# Patient Record
Sex: Female | Born: 1950 | Race: White | Hispanic: No | Marital: Married | Smoking: Never smoker
Health system: Southern US, Community
[De-identification: ages and names within clinical notes are randomized; demographics above are authoritative.]

## PROBLEM LIST (undated history)

## (undated) DIAGNOSIS — N6459 Other signs and symptoms in breast: Secondary | ICD-10-CM

---

## 2003-07-17 ENCOUNTER — Emergency Department (HOSPITAL_COMMUNITY): Admission: EM | Admit: 2003-07-17 | Discharge: 2003-07-18 | Payer: Self-pay | Admitting: Emergency Medicine

## 2003-08-19 ENCOUNTER — Emergency Department (HOSPITAL_COMMUNITY): Admission: EM | Admit: 2003-08-19 | Discharge: 2003-08-19 | Payer: Self-pay

## 2003-12-02 ENCOUNTER — Other Ambulatory Visit: Admission: RE | Admit: 2003-12-02 | Discharge: 2003-12-02 | Payer: Self-pay | Admitting: Obstetrics and Gynecology

## 2004-06-17 ENCOUNTER — Encounter: Admission: RE | Admit: 2004-06-17 | Discharge: 2004-06-17 | Payer: Self-pay | Admitting: Internal Medicine

## 2004-07-07 ENCOUNTER — Encounter: Admission: RE | Admit: 2004-07-07 | Discharge: 2004-07-07 | Payer: Self-pay | Admitting: Internal Medicine

## 2004-07-20 ENCOUNTER — Encounter: Admission: RE | Admit: 2004-07-20 | Discharge: 2004-07-20 | Payer: Self-pay | Admitting: Obstetrics and Gynecology

## 2005-03-23 ENCOUNTER — Other Ambulatory Visit: Admission: RE | Admit: 2005-03-23 | Discharge: 2005-03-23 | Payer: Self-pay | Admitting: Obstetrics and Gynecology

## 2005-03-31 ENCOUNTER — Encounter: Admission: RE | Admit: 2005-03-31 | Discharge: 2005-03-31 | Payer: Self-pay | Admitting: Obstetrics and Gynecology

## 2005-06-28 HISTORY — PX: BREAST EXCISIONAL BIOPSY: SUR124

## 2005-08-02 ENCOUNTER — Ambulatory Visit: Payer: Self-pay | Admitting: Oncology

## 2005-08-11 ENCOUNTER — Encounter: Admission: RE | Admit: 2005-08-11 | Discharge: 2005-08-11 | Payer: Self-pay | Admitting: General Surgery

## 2005-08-25 ENCOUNTER — Encounter: Admission: RE | Admit: 2005-08-25 | Discharge: 2005-08-25 | Payer: Self-pay | Admitting: General Surgery

## 2005-10-26 ENCOUNTER — Ambulatory Visit: Payer: Self-pay | Admitting: Oncology

## 2006-02-07 ENCOUNTER — Ambulatory Visit: Payer: Self-pay | Admitting: Oncology

## 2006-03-04 ENCOUNTER — Encounter: Admission: RE | Admit: 2006-03-04 | Discharge: 2006-03-04 | Payer: Self-pay | Admitting: General Surgery

## 2006-03-28 ENCOUNTER — Emergency Department (HOSPITAL_COMMUNITY): Admission: EM | Admit: 2006-03-28 | Discharge: 2006-03-28 | Payer: Self-pay | Admitting: Family Medicine

## 2006-07-06 ENCOUNTER — Other Ambulatory Visit: Admission: RE | Admit: 2006-07-06 | Discharge: 2006-07-06 | Payer: Self-pay | Admitting: Internal Medicine

## 2006-08-12 ENCOUNTER — Encounter: Admission: RE | Admit: 2006-08-12 | Discharge: 2006-08-12 | Payer: Self-pay | Admitting: Internal Medicine

## 2006-09-14 ENCOUNTER — Ambulatory Visit: Payer: Self-pay | Admitting: Oncology

## 2007-08-14 ENCOUNTER — Encounter: Admission: RE | Admit: 2007-08-14 | Discharge: 2007-08-14 | Payer: Self-pay | Admitting: Internal Medicine

## 2008-02-12 ENCOUNTER — Other Ambulatory Visit: Admission: RE | Admit: 2008-02-12 | Discharge: 2008-02-12 | Payer: Self-pay | Admitting: Obstetrics and Gynecology

## 2008-08-30 ENCOUNTER — Encounter: Admission: RE | Admit: 2008-08-30 | Discharge: 2008-08-30 | Payer: Self-pay | Admitting: Obstetrics and Gynecology

## 2008-08-30 IMAGING — MG MM SCREEN MAMMOGRAM BILATERAL
4 series · 4 of 4 positions shown · non-contrast
Comparison: Prior studies.

DG SCREEN MAMMOGRAM BILATERAL
Bilateral CC and MLO view(s) were taken.
Technologist: FASULI

DIGITAL SCREENING MAMMOGRAM WITH CAD:

[R CC]
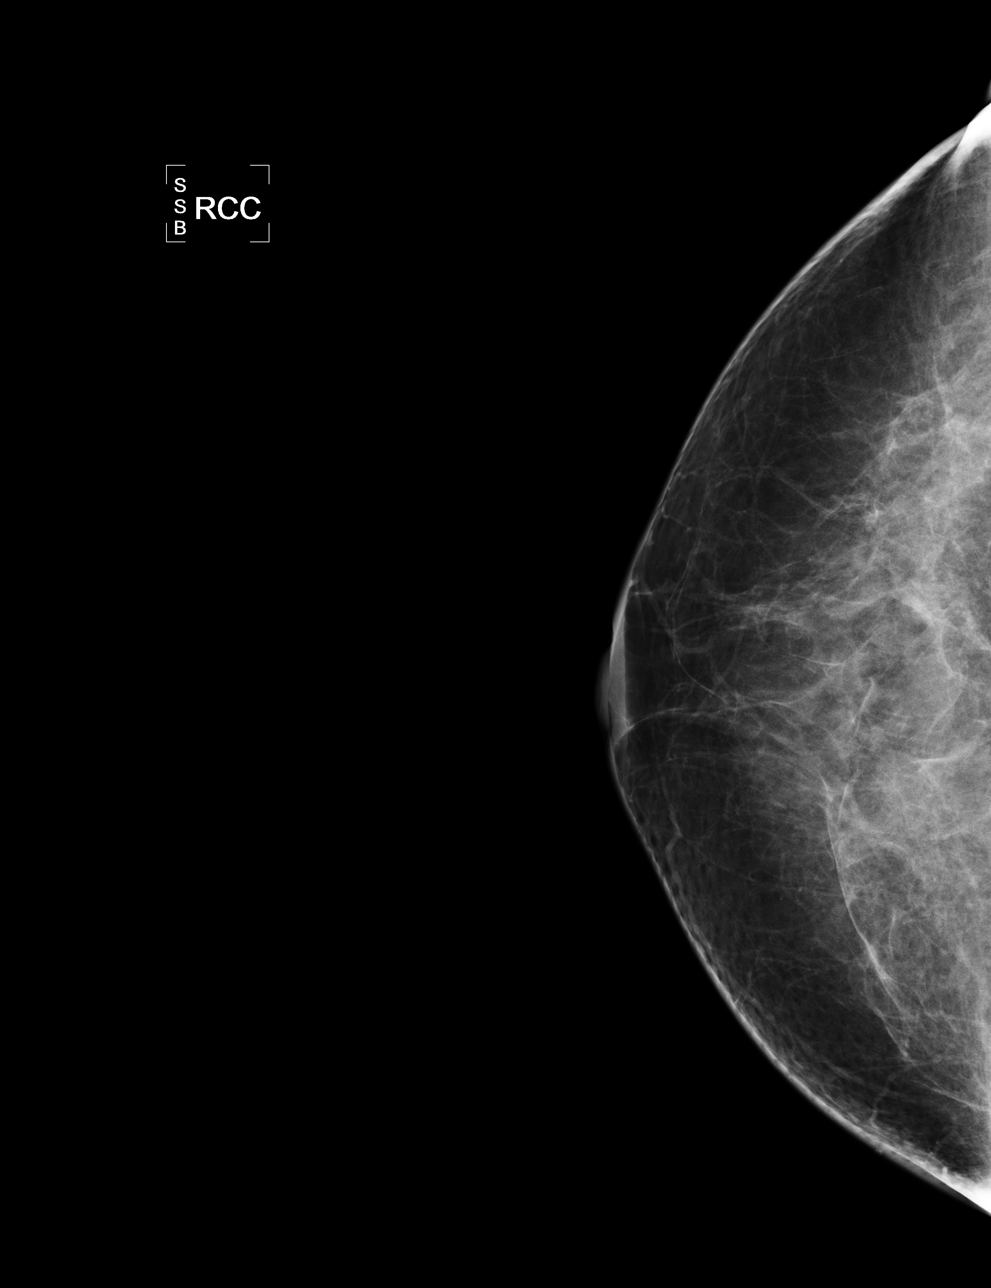

[L CC]
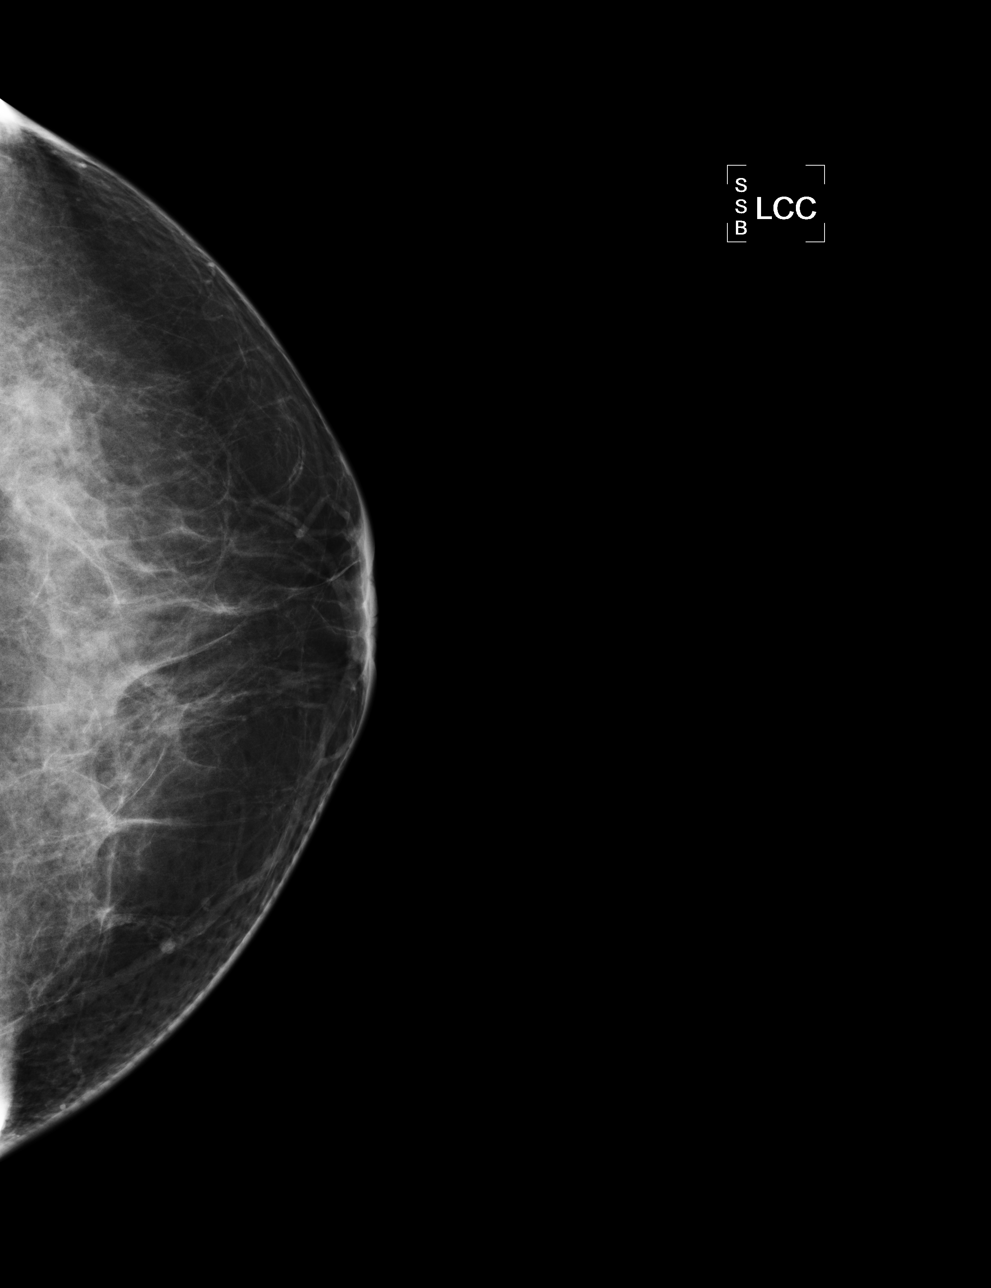

[L MLO]
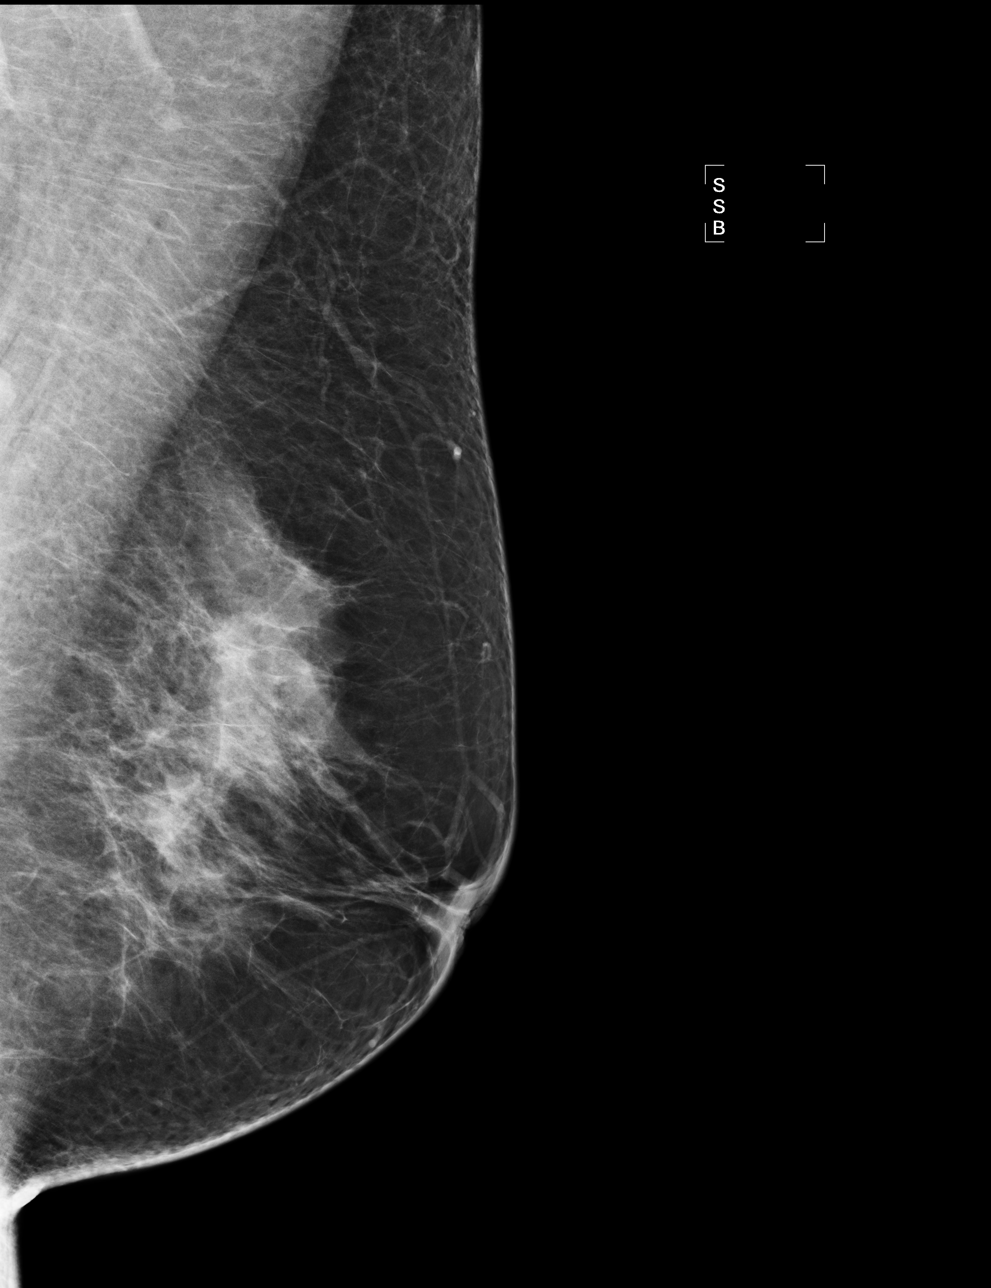

[R MLO]
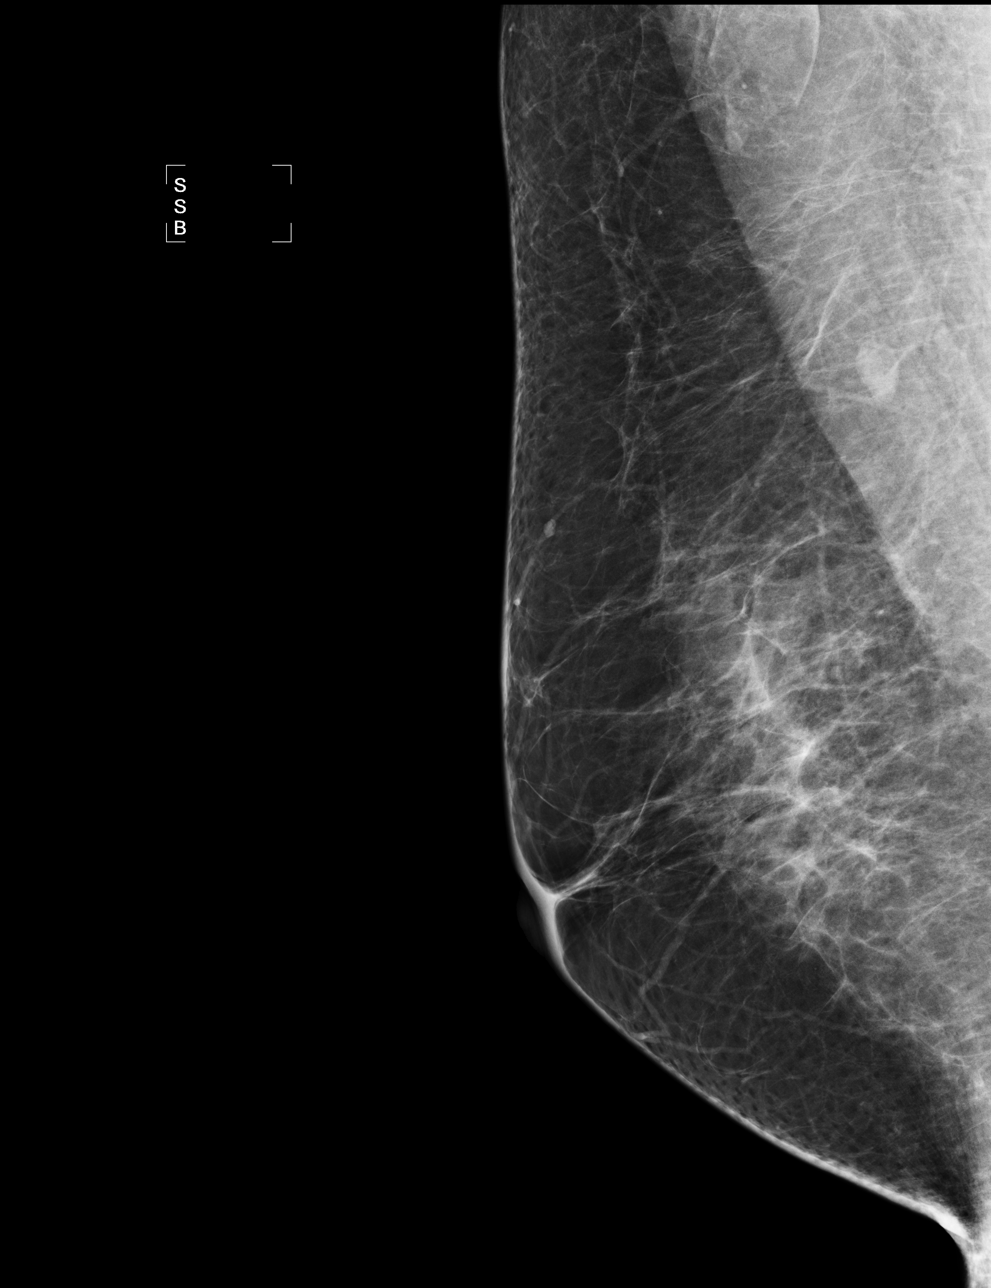

[4 of 4 positions shown; findings below may reference images not displayed]

There are scattered fibroglandular densities.  There is no dominant mass, architectural distortion 
or calcification to suggest malignancy.
IMPRESSION: No mammographic evidence of malignancy.  Suggest yearly screening mammography.

A result letter of this screening mammogram will be mailed directly to the patient.

ASSESSMENT: Negative - BI-RADS 1

Screening mammogram in 1 year.
ANALYZED BY COMPUTER AIDED DETECTION. , THIS PROCEDURE WAS A DIGITAL MAMMOGRAM.

## 2009-02-18 ENCOUNTER — Other Ambulatory Visit: Admission: RE | Admit: 2009-02-18 | Discharge: 2009-02-18 | Payer: Self-pay | Admitting: Obstetrics and Gynecology

## 2009-03-06 ENCOUNTER — Encounter: Admission: RE | Admit: 2009-03-06 | Discharge: 2009-03-06 | Payer: Self-pay | Admitting: Obstetrics and Gynecology

## 2009-09-09 ENCOUNTER — Encounter: Admission: RE | Admit: 2009-09-09 | Discharge: 2009-09-09 | Payer: Self-pay | Admitting: Obstetrics and Gynecology

## 2009-09-16 ENCOUNTER — Encounter: Admission: RE | Admit: 2009-09-16 | Discharge: 2009-09-16 | Payer: Self-pay | Admitting: Obstetrics and Gynecology

## 2010-07-18 ENCOUNTER — Encounter: Payer: Self-pay | Admitting: Internal Medicine

## 2010-08-19 ENCOUNTER — Other Ambulatory Visit: Payer: Self-pay | Admitting: Internal Medicine

## 2010-08-19 ENCOUNTER — Other Ambulatory Visit: Payer: Self-pay | Admitting: Physician Assistant

## 2010-08-19 DIAGNOSIS — Z1231 Encounter for screening mammogram for malignant neoplasm of breast: Secondary | ICD-10-CM

## 2010-09-24 ENCOUNTER — Ambulatory Visit
Admission: RE | Admit: 2010-09-24 | Discharge: 2010-09-24 | Disposition: A | Discharge status: Home / Self Care | Payer: BC Managed Care – PPO | Source: Ambulatory Visit | Attending: Internal Medicine | Admitting: Internal Medicine

## 2010-09-24 DIAGNOSIS — Z1231 Encounter for screening mammogram for malignant neoplasm of breast: Secondary | ICD-10-CM

## 2011-08-16 ENCOUNTER — Other Ambulatory Visit: Payer: Self-pay | Admitting: Internal Medicine

## 2011-08-16 DIAGNOSIS — Z1231 Encounter for screening mammogram for malignant neoplasm of breast: Secondary | ICD-10-CM

## 2011-09-27 ENCOUNTER — Ambulatory Visit
Admission: RE | Admit: 2011-09-27 | Discharge: 2011-09-27 | Disposition: A | Discharge status: Home / Self Care | Payer: BC Managed Care – PPO | Source: Ambulatory Visit | Attending: Internal Medicine | Admitting: Internal Medicine

## 2011-09-27 DIAGNOSIS — Z1231 Encounter for screening mammogram for malignant neoplasm of breast: Secondary | ICD-10-CM

## 2012-08-22 ENCOUNTER — Other Ambulatory Visit: Payer: Self-pay | Admitting: Internal Medicine

## 2012-08-22 DIAGNOSIS — Z1231 Encounter for screening mammogram for malignant neoplasm of breast: Secondary | ICD-10-CM

## 2012-09-28 ENCOUNTER — Ambulatory Visit
Admission: RE | Admit: 2012-09-28 | Discharge: 2012-09-28 | Disposition: A | Discharge status: Home / Self Care | Payer: BC Managed Care – PPO | Source: Ambulatory Visit | Attending: Internal Medicine | Admitting: Internal Medicine

## 2012-09-28 DIAGNOSIS — Z1231 Encounter for screening mammogram for malignant neoplasm of breast: Secondary | ICD-10-CM

## 2013-08-30 ENCOUNTER — Other Ambulatory Visit: Payer: Self-pay

## 2013-08-30 ENCOUNTER — Other Ambulatory Visit: Payer: Self-pay | Admitting: Internal Medicine

## 2013-08-30 DIAGNOSIS — Z1231 Encounter for screening mammogram for malignant neoplasm of breast: Secondary | ICD-10-CM

## 2013-10-17 ENCOUNTER — Ambulatory Visit: Payer: BC Managed Care – PPO

## 2013-11-06 ENCOUNTER — Ambulatory Visit
Admission: RE | Admit: 2013-11-06 | Discharge: 2013-11-06 | Disposition: A | Discharge status: Home / Self Care | Payer: BC Managed Care – PPO | Source: Ambulatory Visit | Attending: Internal Medicine | Admitting: Internal Medicine

## 2013-11-06 DIAGNOSIS — Z1231 Encounter for screening mammogram for malignant neoplasm of breast: Secondary | ICD-10-CM

## 2014-09-30 ENCOUNTER — Other Ambulatory Visit: Payer: Self-pay

## 2014-09-30 DIAGNOSIS — Z1231 Encounter for screening mammogram for malignant neoplasm of breast: Secondary | ICD-10-CM

## 2014-11-12 ENCOUNTER — Ambulatory Visit
Admission: RE | Admit: 2014-11-12 | Discharge: 2014-11-12 | Disposition: A | Discharge status: Home / Self Care | Payer: BC Managed Care – PPO | Source: Ambulatory Visit

## 2014-11-12 DIAGNOSIS — Z1231 Encounter for screening mammogram for malignant neoplasm of breast: Secondary | ICD-10-CM

## 2014-12-24 ENCOUNTER — Ambulatory Visit: Payer: BC Managed Care – PPO

## 2014-12-24 ENCOUNTER — Ambulatory Visit (INDEPENDENT_AMBULATORY_CARE_PROVIDER_SITE_OTHER): Payer: BC Managed Care – PPO | Admitting: Podiatry

## 2014-12-24 ENCOUNTER — Ambulatory Visit (INDEPENDENT_AMBULATORY_CARE_PROVIDER_SITE_OTHER): Payer: BC Managed Care – PPO

## 2014-12-24 ENCOUNTER — Encounter: Payer: Self-pay | Admitting: Podiatry

## 2014-12-24 VITALS — BP 125/75 | HR 81 | Resp 12

## 2014-12-24 DIAGNOSIS — B351 Tinea unguium: Secondary | ICD-10-CM

## 2014-12-24 DIAGNOSIS — M779 Enthesopathy, unspecified: Secondary | ICD-10-CM

## 2014-12-24 DIAGNOSIS — R52 Pain, unspecified: Secondary | ICD-10-CM

## 2014-12-24 DIAGNOSIS — L600 Ingrowing nail: Secondary | ICD-10-CM

## 2014-12-24 NOTE — Patient Instructions (Signed)

## 2014-12-24 NOTE — Progress Notes (Signed)
   Subjective:    Patient ID: Ruth Warren, female    DOB: 05-29-1951, [age] y.o.   MRN: [id number]  HPI L Great toe nail fungus, R great toe nail, thickened and prone to ingrowns. May need debridement   Review of Systems     Objective:   Physical Exam        Assessment & Plan:

## 2014-12-24 NOTE — Progress Notes (Signed)
Subjective:     Patient ID: Ruth Warren, female   DOB: [birth year], 64 y.o.   MRN: [id number]  HPI patient presents with several problems one being incurvated nail border right hallux medial border that's painful thickened left hallux nail that she's not sure what can be done with and discomfort in the arch of both feet along with structural bunion deformity bilateral which is gotten gradually worse   Review of Systems  All other systems reviewed and are negative.      Objective:   Physical Exam  Constitutional: She is oriented to person, place, and time.  Cardiovascular: Intact distal pulses.   Musculoskeletal: Normal range of motion.  Neurological: She is oriented to person, place, and time.  Skin: Skin is warm.  Nursing note and vitals reviewed.  neurovascular status intact muscle strength adequate with range of motion subtalar midtarsal joint within normal limits. Patient's noted to have incurvated right hallux medial border that's painful when pressed hyperostosis medial aspect first metatarsal bilateral with rigid contracture second digit left and also noted to have a thickened hallux nail left that's moderately loose not painful and pain in the arch region of both feet when palpated. Good digital perfusion is noted patient's well oriented 3     Assessment:     Ingrown toenail deformity right hallux medial border with pain along with thick and mycotic nail left hallux that is localized in nature and arch pain secondary to diminishment of the arch along with structural imbalances of both feet    Plan:     H&P and all conditions reviewed with patient. At this point I recommended correction of the ingrown toenail and explained the procedure to patient and risk involved. She wants surgery and I infiltrated 60 Milligan times like Marcaine mixture remove the hallux nail border exposed matrix and applied phenol 3 applications 30 seconds followed by alcohol lavage and sterile dressing. I  then scanned for custom orthotics to lift up the arches of both feet and reviewed her x-rays and discussed surgical correction at one point in the future

## 2014-12-30 ENCOUNTER — Encounter: Payer: Self-pay | Admitting: Podiatry

## 2015-01-24 ENCOUNTER — Ambulatory Visit: Payer: BC Managed Care – PPO | Admitting: *Deleted

## 2015-01-24 DIAGNOSIS — M779 Enthesopathy, unspecified: Secondary | ICD-10-CM

## 2015-01-24 NOTE — Patient Instructions (Signed)

## 2015-01-24 NOTE — Progress Notes (Signed)
Patient ID: GAE BIHL, female   DOB: [birth year], 64 y.o.   MRN: [id number] Patient presents for orthotic pick up.  Verbal and written break in and wear instructions given.  Patient will follow up in 4 weeks if symptoms worsen or fail to improve.

## 2015-08-20 ENCOUNTER — Encounter: Payer: Self-pay | Admitting: Podiatry

## 2015-08-20 ENCOUNTER — Ambulatory Visit (INDEPENDENT_AMBULATORY_CARE_PROVIDER_SITE_OTHER): Payer: BC Managed Care – PPO | Admitting: Podiatry

## 2015-08-20 DIAGNOSIS — L600 Ingrowing nail: Secondary | ICD-10-CM

## 2015-08-20 NOTE — Patient Instructions (Addendum)

## 2015-08-21 NOTE — Progress Notes (Signed)
Subjective:     Patient ID: Ruth Warren, female   DOB: 09-20-1950, 65 y.o.   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 Patient wants procedure understanding risk and today I infiltrated the right hallux 60 mg Xylocaine Marcaine mixture and remove the hallux nail exposing matrix and applying phenol 5 applications 30 seconds followed by alcohol lavage and sterile dressing. Gave instructions on soaks and reappoint

## 2015-08-28 ENCOUNTER — Telehealth: Payer: Self-pay | Admitting: *Deleted

## 2015-08-28 NOTE — Telephone Encounter (Signed)
Left message for patient at [phone] (Home #) to check to see how they were doing from their ingrown toenail procedure that was performed on Wednesday, August 20, 2015. Waiting for a response.

## 2015-09-27 ENCOUNTER — Encounter: Payer: Self-pay | Admitting: Podiatry

## 2015-09-29 ENCOUNTER — Telehealth: Payer: Self-pay | Admitting: Podiatry

## 2015-09-29 NOTE — Telephone Encounter (Signed)
Called pt per Val and she said she went to [facility] over the weekend because her toe looked infected and they put her on antibiotics. They told pt if it was not better in 3 to 4 days to call our office to get an appt to see Dr Charlsie Merlesegal. I tired to schedule pt for later this week and she said she would call if needed. She does not know what her schedule at work is at this time.

## 2015-10-07 ENCOUNTER — Other Ambulatory Visit: Payer: Self-pay

## 2015-10-07 DIAGNOSIS — Z1231 Encounter for screening mammogram for malignant neoplasm of breast: Secondary | ICD-10-CM

## 2015-10-25 ENCOUNTER — Encounter: Payer: Self-pay | Admitting: Podiatry

## 2015-11-13 ENCOUNTER — Ambulatory Visit (INDEPENDENT_AMBULATORY_CARE_PROVIDER_SITE_OTHER): Payer: BC Managed Care – PPO | Admitting: Podiatry

## 2015-11-13 ENCOUNTER — Encounter: Payer: Self-pay | Admitting: Podiatry

## 2015-11-13 DIAGNOSIS — M779 Enthesopathy, unspecified: Secondary | ICD-10-CM

## 2015-11-13 NOTE — Progress Notes (Signed)
Subjective:     Patient ID: Ruth Warren, female   DOB: 07-11-1950, 65 y.o.   MRN: [id number]  HPI patient states I need some new orthotics made and the other ones are doing well but they're starting to fall apart   Review of Systems     Objective:   Physical Exam Neurovascular status intact muscle strength adequate with patient found to have well structured orthotics that are losing some of their shap tendone    Assessment:     Tendinitis symptoms improving with orthotics    Plan:     Patient will have second pair of orthotics made and minimal get the first rehabilitation and we'll go 20 alternating like systems

## 2015-11-20 ENCOUNTER — Ambulatory Visit
Admission: RE | Admit: 2015-11-20 | Discharge: 2015-11-20 | Disposition: A | Discharge status: Home / Self Care | Payer: BC Managed Care – PPO | Source: Ambulatory Visit

## 2015-11-20 DIAGNOSIS — Z1231 Encounter for screening mammogram for malignant neoplasm of breast: Secondary | ICD-10-CM

## 2015-11-25 ENCOUNTER — Other Ambulatory Visit: Payer: Self-pay | Admitting: Internal Medicine

## 2015-11-25 DIAGNOSIS — R928 Other abnormal and inconclusive findings on diagnostic imaging of breast: Secondary | ICD-10-CM

## 2015-12-02 ENCOUNTER — Ambulatory Visit
Admission: RE | Admit: 2015-12-02 | Discharge: 2015-12-02 | Disposition: A | Discharge status: Home / Self Care | Payer: BC Managed Care – PPO | Source: Ambulatory Visit | Attending: Internal Medicine | Admitting: Internal Medicine

## 2015-12-02 DIAGNOSIS — R928 Other abnormal and inconclusive findings on diagnostic imaging of breast: Secondary | ICD-10-CM

## 2015-12-09 DIAGNOSIS — M722 Plantar fascial fibromatosis: Secondary | ICD-10-CM

## 2016-10-18 ENCOUNTER — Other Ambulatory Visit: Payer: Self-pay | Admitting: Internal Medicine

## 2016-10-18 DIAGNOSIS — Z1231 Encounter for screening mammogram for malignant neoplasm of breast: Secondary | ICD-10-CM

## 2016-12-01 ENCOUNTER — Ambulatory Visit: Payer: BC Managed Care – PPO

## 2016-12-02 ENCOUNTER — Ambulatory Visit: Payer: BC Managed Care – PPO

## 2016-12-07 ENCOUNTER — Ambulatory Visit
Admission: RE | Admit: 2016-12-07 | Discharge: 2016-12-07 | Disposition: A | Discharge status: Home / Self Care | Payer: BC Managed Care – PPO | Source: Ambulatory Visit | Attending: Internal Medicine | Admitting: Internal Medicine

## 2016-12-07 DIAGNOSIS — Z1231 Encounter for screening mammogram for malignant neoplasm of breast: Secondary | ICD-10-CM

## 2017-10-24 ENCOUNTER — Other Ambulatory Visit: Payer: Self-pay | Admitting: Internal Medicine

## 2017-10-24 DIAGNOSIS — Z1231 Encounter for screening mammogram for malignant neoplasm of breast: Secondary | ICD-10-CM

## 2017-12-07 ENCOUNTER — Ambulatory Visit
Admission: RE | Admit: 2017-12-07 | Discharge: 2017-12-07 | Disposition: A | Discharge status: Home / Self Care | Payer: BC Managed Care – PPO | Source: Ambulatory Visit | Attending: Internal Medicine | Admitting: Internal Medicine

## 2017-12-07 DIAGNOSIS — Z1231 Encounter for screening mammogram for malignant neoplasm of breast: Secondary | ICD-10-CM

## 2018-10-24 ENCOUNTER — Other Ambulatory Visit: Payer: Self-pay | Admitting: Internal Medicine

## 2018-10-24 DIAGNOSIS — Z1231 Encounter for screening mammogram for malignant neoplasm of breast: Secondary | ICD-10-CM

## 2018-12-25 ENCOUNTER — Other Ambulatory Visit: Payer: Self-pay

## 2018-12-25 ENCOUNTER — Ambulatory Visit
Admission: RE | Admit: 2018-12-25 | Discharge: 2018-12-25 | Disposition: A | Discharge status: Home / Self Care | Payer: BC Managed Care – PPO | Source: Ambulatory Visit | Attending: Internal Medicine | Admitting: Internal Medicine

## 2018-12-25 DIAGNOSIS — Z1231 Encounter for screening mammogram for malignant neoplasm of breast: Secondary | ICD-10-CM

## 2018-12-25 HISTORY — DX: Other signs and symptoms in breast: N64.59

## 2019-03-14 ENCOUNTER — Other Ambulatory Visit: Payer: Self-pay | Admitting: Internal Medicine

## 2019-03-14 ENCOUNTER — Other Ambulatory Visit (HOSPITAL_COMMUNITY)
Admission: RE | Admit: 2019-03-14 | Discharge: 2019-03-14 | Disposition: A | Discharge status: Home / Self Care | Payer: Medicare Other | Source: Ambulatory Visit | Attending: Internal Medicine | Admitting: Internal Medicine

## 2019-03-14 DIAGNOSIS — Z124 Encounter for screening for malignant neoplasm of cervix: Secondary | ICD-10-CM | POA: Diagnosis present

## 2019-03-18 LAB — CYTOLOGY - PAP: Diagnosis: NEGATIVE

## 2019-05-04 ENCOUNTER — Other Ambulatory Visit: Payer: Self-pay | Admitting: Gastroenterology

## 2019-05-04 ENCOUNTER — Ambulatory Visit
Admission: RE | Admit: 2019-05-04 | Discharge: 2019-05-04 | Disposition: A | Discharge status: Home / Self Care | Payer: Medicare Other | Source: Ambulatory Visit | Attending: Gastroenterology | Admitting: Gastroenterology

## 2019-05-04 DIAGNOSIS — Z1509 Genetic susceptibility to other malignant neoplasm: Secondary | ICD-10-CM

## 2019-06-01 ENCOUNTER — Ambulatory Visit
Admission: RE | Admit: 2019-06-01 | Discharge: 2019-06-01 | Disposition: A | Discharge status: Home / Self Care | Payer: Medicare Other | Source: Ambulatory Visit | Attending: Internal Medicine | Admitting: Internal Medicine

## 2019-06-01 ENCOUNTER — Other Ambulatory Visit: Payer: Self-pay | Admitting: Internal Medicine

## 2019-06-01 DIAGNOSIS — M549 Dorsalgia, unspecified: Secondary | ICD-10-CM

## 2019-07-25 ENCOUNTER — Ambulatory Visit: Payer: Medicare Other

## 2019-07-29 ENCOUNTER — Ambulatory Visit: Payer: Medicare Other

## 2019-08-03 ENCOUNTER — Ambulatory Visit: Payer: Medicare PPO | Attending: Internal Medicine

## 2019-08-03 ENCOUNTER — Ambulatory Visit: Payer: Medicare Other

## 2019-08-03 DIAGNOSIS — Z23 Encounter for immunization: Secondary | ICD-10-CM | POA: Insufficient documentation

## 2019-08-03 NOTE — Progress Notes (Signed)
   Covid-19 Vaccination Clinic  Name:  MICHAELLA IMAI    MRN: [id number] DOB: 28-Feb-1951  08/03/2019  Ms. Hendryx was observed post Covid-19 immunization for 15 minutes without incidence. She was provided with Vaccine Information Sheet and instruction to access the V-Safe system.   Ms. Tinner was instructed to call 911 with any severe reactions post vaccine: Marland Kitchen Difficulty breathing  . Swelling of your face and throat  . A fast heartbeat  . A bad rash all over your body  . Dizziness and weakness    Immunizations Administered    Name Date Dose VIS Date Route   Pfizer COVID-19 Vaccine 08/03/2019 10:11 AM 0.3 mL 06/08/2019 Intramuscular   Manufacturer: [employer], [employer]   Lot: [id number]   NDC: [address]-8

## 2019-08-05 ENCOUNTER — Ambulatory Visit: Payer: Medicare Other

## 2019-08-28 ENCOUNTER — Ambulatory Visit: Payer: Medicare PPO

## 2019-08-28 NOTE — Progress Notes (Signed)
   Covid-19 Vaccination Clinic  Name:  Ruth Warren    MRN: [id number] DOB: [birth year]  08/28/2019  Ruth Warren was observed post Covid-19 immunization for 15 minutes without incident. She was provided with Vaccine Information Sheet and instruction to access the V-Safe system.   Ruth Warren was instructed to call 911 with any severe reactions post vaccine: Marland Kitchen Difficulty breathing  . Swelling of face and throat  . A fast heartbeat  . A bad rash all over body  . Dizziness and weakness

## 2019-11-13 ENCOUNTER — Other Ambulatory Visit: Payer: Self-pay | Admitting: Family Medicine

## 2019-11-13 ENCOUNTER — Other Ambulatory Visit: Payer: Self-pay | Admitting: Internal Medicine

## 2019-11-13 DIAGNOSIS — Z1231 Encounter for screening mammogram for malignant neoplasm of breast: Secondary | ICD-10-CM

## 2020-01-09 ENCOUNTER — Other Ambulatory Visit: Payer: Self-pay

## 2020-01-09 ENCOUNTER — Ambulatory Visit
Admission: RE | Admit: 2020-01-09 | Discharge: 2020-01-09 | Disposition: A | Discharge status: Home / Self Care | Payer: Medicare PPO | Source: Ambulatory Visit | Attending: Family Medicine | Admitting: Family Medicine

## 2020-01-09 DIAGNOSIS — Z1231 Encounter for screening mammogram for malignant neoplasm of breast: Secondary | ICD-10-CM

## 2020-02-08 ENCOUNTER — Encounter: Payer: Self-pay | Admitting: Genetic Counselor

## 2020-10-31 ENCOUNTER — Other Ambulatory Visit: Payer: Self-pay | Admitting: *Deleted

## 2020-10-31 DIAGNOSIS — R5381 Other malaise: Secondary | ICD-10-CM
